# Patient Record
Sex: Male | Born: 1990 | Race: Black or African American | Hispanic: No | Marital: Married | Smoking: Light tobacco smoker
Health system: Southern US, Community
[De-identification: ages and names within clinical notes are randomized; demographics above are authoritative.]

---
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
## 2008-12-31 ENCOUNTER — Emergency Department (HOSPITAL_COMMUNITY): Admission: EM | Admit: 2008-12-31 | Discharge: 2008-12-31 | Payer: Self-pay | Admitting: Emergency Medicine

## 2009-01-09 ENCOUNTER — Encounter: Admission: RE | Admit: 2009-01-09 | Discharge: 2009-01-09 | Payer: Self-pay | Admitting: Orthopedic Surgery

## 2010-05-15 IMAGING — CR DG WRIST COMPLETE 3+V*L*
4 series · 4 of 4 positions shown · non-contrast
Comparison: None

CLINICAL DATA: Fell.

LEFT WRIST - COMPLETE 3+ VIEW

[x wrist pa left]
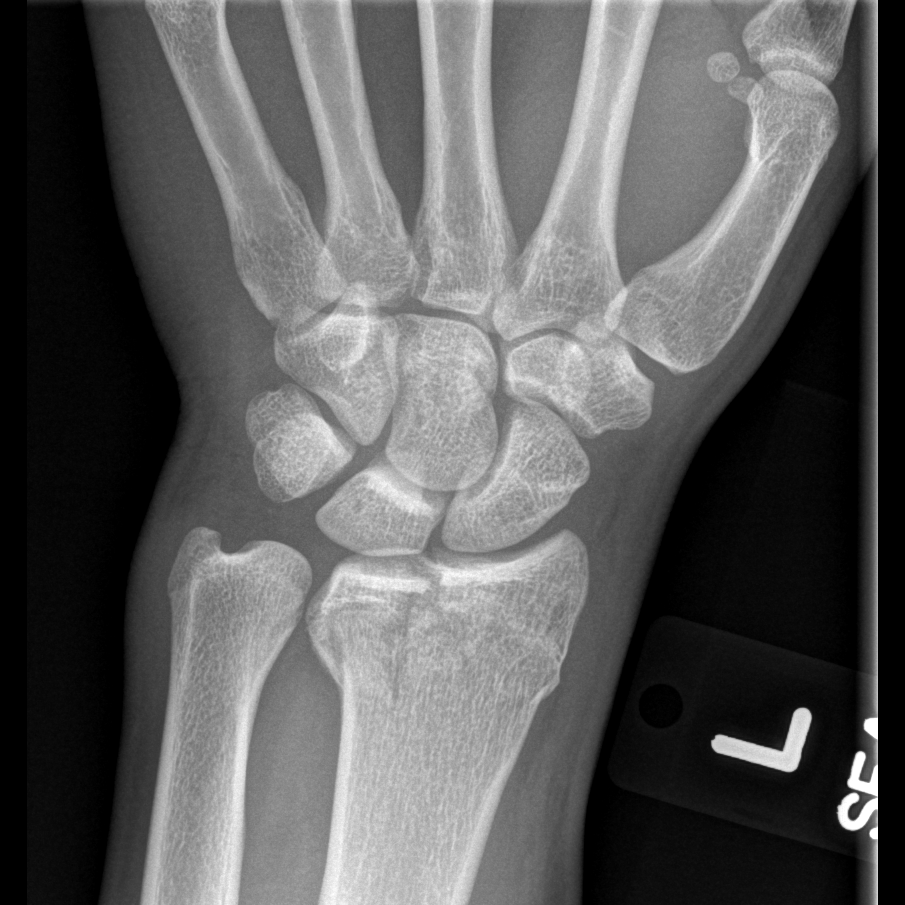

[x wrist obl left]
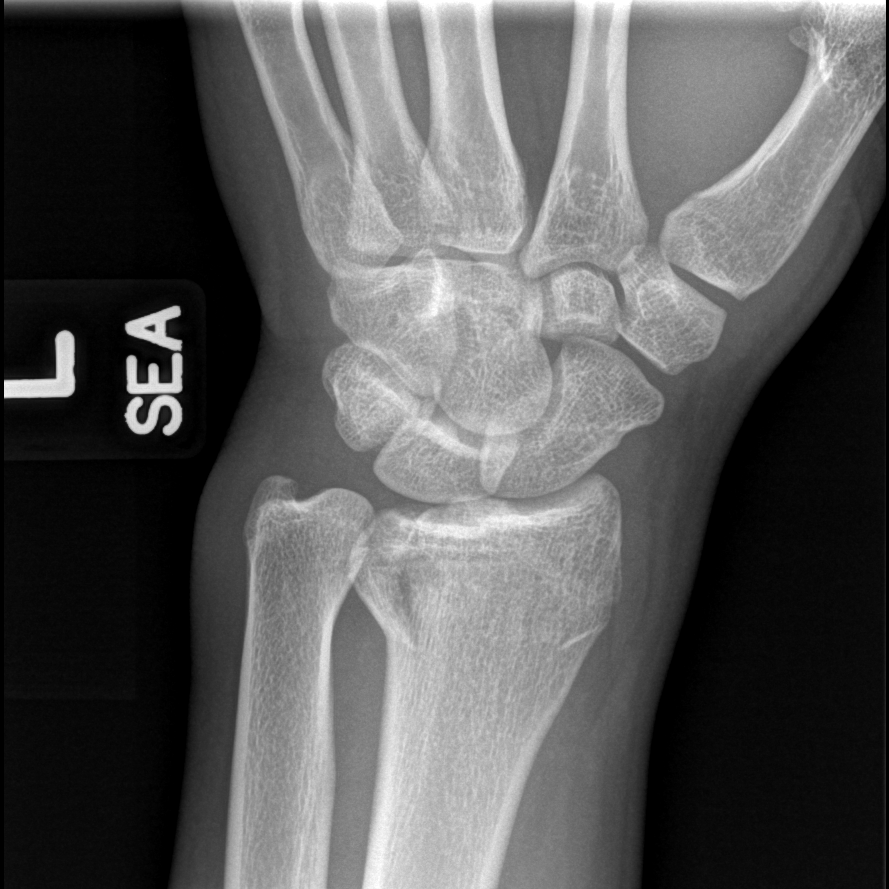

[x wrist lat left]
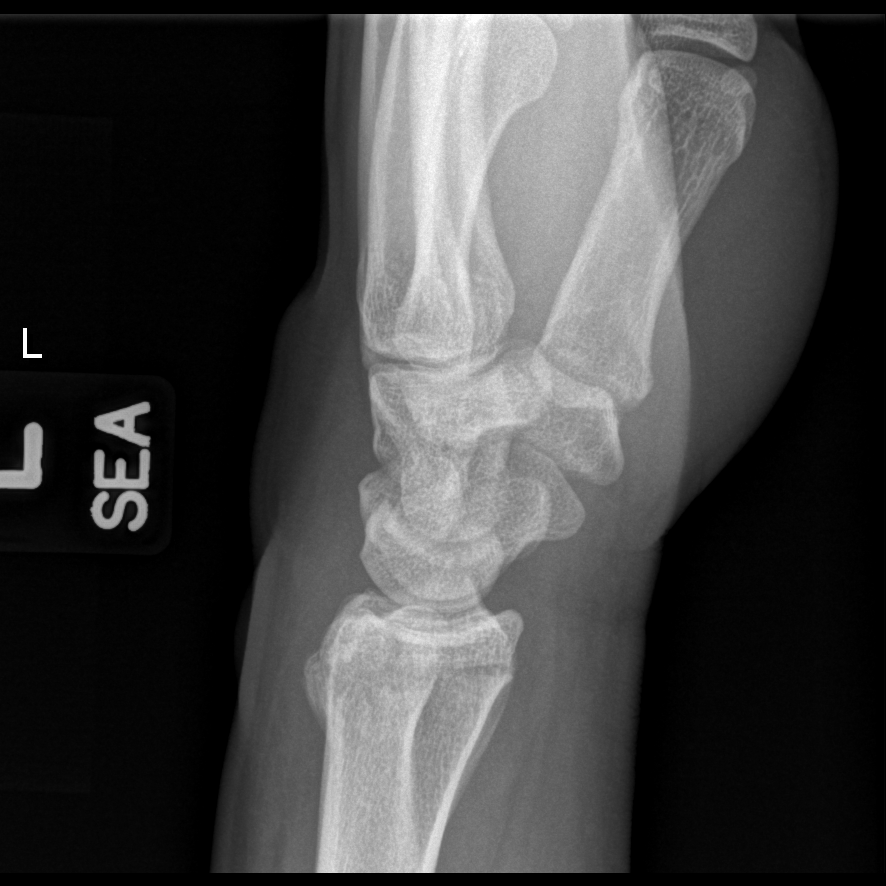

[x navicular]
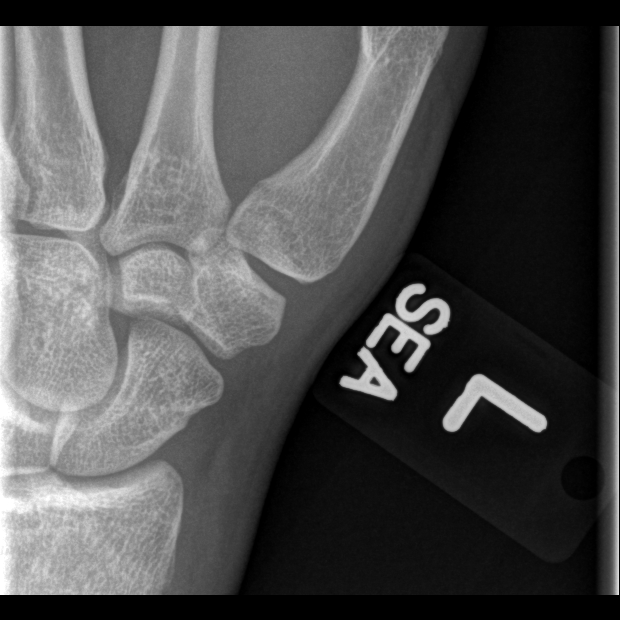

[4 of 4 positions shown; findings below may reference images not displayed]

FINDINGS: There is an intra-articular distal radius fracture with
minimal displacement.  There is also mild impaction.  No ulnar
fractures seen.  Carpal bones are intact .
IMPRESSION: Intra-articular fracture of the distal radius.

## 2014-01-04 ENCOUNTER — Telehealth: Payer: Self-pay | Admitting: General Practice

## 2014-01-04 NOTE — Telephone Encounter (Signed)
Ok with me 

## 2014-01-04 NOTE — Telephone Encounter (Signed)
Pt called request to be Dr. Yetta BarreJones new pt [facility] because his father Townsend RogerLeonard Dobies [birth year] is Dr. Yetta BarreJones pt already. Please advise.

## 2014-01-04 NOTE — Telephone Encounter (Signed)
The father will call to make an appt as a new pt with Dr. Yetta BarreJones when he gets home.

## 2014-01-26 ENCOUNTER — Ambulatory Visit: Payer: Self-pay | Admitting: Internal Medicine

## 2014-01-26 DIAGNOSIS — Z0289 Encounter for other administrative examinations: Secondary | ICD-10-CM

## 2018-11-18 ENCOUNTER — Other Ambulatory Visit: Payer: Self-pay

## 2018-11-18 ENCOUNTER — Emergency Department (HOSPITAL_COMMUNITY)
Admission: EM | Admit: 2018-11-18 | Discharge: 2018-11-18 | Disposition: A | Discharge status: Home / Self Care | Payer: Self-pay | Attending: Emergency Medicine | Admitting: Emergency Medicine

## 2018-11-18 ENCOUNTER — Encounter (HOSPITAL_COMMUNITY): Payer: Self-pay | Admitting: Emergency Medicine

## 2018-11-18 ENCOUNTER — Emergency Department (HOSPITAL_COMMUNITY): Payer: Self-pay

## 2018-11-18 DIAGNOSIS — R109 Unspecified abdominal pain: Secondary | ICD-10-CM | POA: Insufficient documentation

## 2018-11-18 DIAGNOSIS — R112 Nausea with vomiting, unspecified: Secondary | ICD-10-CM | POA: Insufficient documentation

## 2018-11-18 LAB — CBC
HCT: 50.2 % (ref 39.0–52.0)
Hemoglobin: 16.4 g/dL (ref 13.0–17.0)
MCH: 29.6 pg (ref 26.0–34.0)
MCHC: 32.7 g/dL (ref 30.0–36.0)
MCV: 90.6 fL (ref 80.0–100.0)
PLATELETS: 182 10*3/uL (ref 150–400)
RBC: 5.54 MIL/uL (ref 4.22–5.81)
RDW: 12 % (ref 11.5–15.5)
WBC: 9.1 10*3/uL (ref 4.0–10.5)
nRBC: 0 % (ref 0.0–0.2)

## 2018-11-18 LAB — COMPREHENSIVE METABOLIC PANEL
ALBUMIN: 4.3 g/dL (ref 3.5–5.0)
ALT: 76 U/L — ABNORMAL HIGH (ref 0–44)
AST: 67 U/L — ABNORMAL HIGH (ref 15–41)
Alkaline Phosphatase: 58 U/L (ref 38–126)
Anion gap: 11 (ref 5–15)
BILIRUBIN TOTAL: 0.8 mg/dL (ref 0.3–1.2)
BUN: 10 mg/dL (ref 6–20)
CALCIUM: 9.7 mg/dL (ref 8.9–10.3)
CO2: 25 mmol/L (ref 22–32)
Chloride: 102 mmol/L (ref 98–111)
Creatinine, Ser: 0.96 mg/dL (ref 0.61–1.24)
GFR calc Af Amer: >60 mL/min (ref >60)
GFR calc non Af Amer: >60 mL/min (ref >60)
GLUCOSE: 109 mg/dL — AB (ref 70–99)
Potassium: 4.3 mmol/L (ref 3.5–5.1)
SODIUM: 138 mmol/L (ref 135–145)
TOTAL PROTEIN: 7.8 g/dL (ref 6.5–8.1)

## 2018-11-18 LAB — LIPASE, BLOOD: Lipase: 33 U/L (ref 11–51)

## 2018-11-18 MED ORDER — ONDANSETRON 4 MG PO TBDP
4.0000 mg | ORAL_TABLET | Freq: Once | ORAL | Status: AC | PRN
  Administered 2018-11-18: 4 mg via ORAL
  Filled 2018-11-18: qty 1

## 2018-11-18 MED ORDER — ONDANSETRON HCL 4 MG/2ML IJ SOLN
4.0000 mg | Freq: Once | INTRAMUSCULAR | Status: AC
  Administered 2018-11-18: 4 mg via INTRAVENOUS
  Filled 2018-11-18: qty 2

## 2018-11-18 MED ORDER — SODIUM CHLORIDE 0.9% FLUSH
3.0000 mL | Freq: Once | INTRAVENOUS | Status: AC
  Administered 2018-11-18: 3 mL via INTRAVENOUS

## 2018-11-18 MED ORDER — DICYCLOMINE HCL 10 MG PO CAPS
10.0000 mg | ORAL_CAPSULE | Freq: Once | ORAL | Status: AC
  Administered 2018-11-18: 10 mg via ORAL
  Filled 2018-11-18: qty 1

## 2018-11-18 MED ORDER — ONDANSETRON 8 MG PO TBDP: 8.0000 mg | ORAL_TABLET | Freq: Three times a day (TID) | ORAL | 0 refills | Status: DC | PRN

## 2018-11-18 MED ORDER — SODIUM CHLORIDE 0.9 % IV BOLUS
1000.0000 mL | Freq: Once | INTRAVENOUS | Status: AC
  Administered 2018-11-18: 1000 mL via INTRAVENOUS

## 2018-11-18 MED ORDER — DICYCLOMINE HCL 20 MG PO TABS: 20.0000 mg | ORAL_TABLET | Freq: Two times a day (BID) | ORAL | 0 refills | Status: DC | PRN

## 2018-11-18 NOTE — ED Notes (Signed)
Discharge instructions and prescriptions discussed with Pt. Pt verbalized understanding. Pt stable and ambulatory.   

## 2018-11-18 NOTE — Discharge Instructions (Signed)
Take the medications as needed for nausea, follow up with a primary care doctor if the symptoms persist, return as needed for worsening symptoms

## 2018-11-18 NOTE — ED Provider Notes (Signed)
[facility] EMERGENCY DEPARTMENT Provider Note   CSN: [id number] Arrival date & time: 11/18/18  1611     History   Chief Complaint Chief Complaint  Patient presents with  . Emesis    HPI Sean Simon is a 28 y.o. male.  HPI Presents to the emergency room for evaluation of abdominal pain nausea and vomiting.  Patient states he has been having trouble with nausea for the past several days.  However today he had increasing symptoms with multiple episodes of dry heaves and vomiting.  Patient states he has had 8 episodes today.  He then vomited while waiting here in the emergency room.  When he has the vomiting he experiences severe abdominal cramping primarily in the lower abdomen.  He has not been able to keep down any food.  Patient denies any diarrhea or constipation.  He denies any dysuria. History reviewed. No pertinent past medical history.  There are no active problems to display for this patient.   History reviewed. No pertinent surgical history.      Home Medications    Prior to Admission medications   Medication Sig Start Date End Date Taking? Authorizing Provider  dicyclomine (BENTYL) 20 MG tablet Take 1 tablet (20 mg total) by mouth 2 (two) times daily as needed for spasms. 11/18/18   Linwood Dibbles, MD  ondansetron (ZOFRAN ODT) 8 MG disintegrating tablet Take 1 tablet (8 mg total) by mouth every 8 (eight) hours as needed for nausea or vomiting. 11/18/18   Linwood Dibbles, MD    Family History No family history on file.  Social History Social History   Tobacco Use  . Smoking status: Not on file  Substance Use Topics  . Alcohol use: Not on file  . Drug use: Not on file     Allergies   Patient has no known allergies.   Review of Systems Review of Systems  All other systems reviewed and are negative.    Physical Exam Updated Vital Signs BP 134/88   Pulse 76   Temp 98.4 F (36.9 C) (Oral)   Resp (!) 21   Ht 1.829 m (6')   Wt 111.1 kg    SpO2 100%   BMI 33.23 kg/m   Physical Exam Vitals signs and nursing note reviewed.  Constitutional:      General: He is not in acute distress.    Appearance: He is well-developed.  HENT:     Head: Normocephalic and atraumatic.     Right Ear: External ear normal.     Left Ear: External ear normal.  Eyes:     General: No scleral icterus.       Right eye: No discharge.        Left eye: No discharge.     Conjunctiva/sclera: Conjunctivae normal.  Neck:     Musculoskeletal: Neck supple.     Trachea: No tracheal deviation.  Cardiovascular:     Rate and Rhythm: Normal rate and regular rhythm.  Pulmonary:     Effort: Pulmonary effort is normal. No respiratory distress.     Breath sounds: Normal breath sounds. No stridor. No wheezing or rales.  Abdominal:     General: Bowel sounds are normal. There is no distension.     Palpations: Abdomen is soft.     Tenderness: There is no abdominal tenderness. There is no guarding or rebound.  Musculoskeletal:        General: No tenderness.  Skin:    General: Skin is warm  and dry.     Findings: No rash.  Neurological:     Mental Status: He is alert.     Cranial Nerves: No cranial nerve deficit (no facial droop, extraocular movements intact, no slurred speech).     Sensory: No sensory deficit.     Motor: No abnormal muscle tone or seizure activity.     Coordination: Coordination normal.      ED Treatments / Results  Labs (all labs ordered are listed, but only abnormal results are displayed) Labs Reviewed  COMPREHENSIVE METABOLIC PANEL - Abnormal; Notable for the following components:      Result Value   Glucose, Bld 109 (*)    AST 67 (*)    ALT 76 (*)    All other components within normal limits  LIPASE, BLOOD  CBC  URINALYSIS, ROUTINE W REFLEX MICROSCOPIC     Radiology [place] Abdomen Complete  Result Date: 11/18/2018 CLINICAL DATA:  Abdominal pain, nausea, and vomiting. Elevated liver function studies. EXAM: ABDOMEN ULTRASOUND  COMPLETE COMPARISON:  None. FINDINGS: Gallbladder: No gallstones or wall thickening visualized. No sonographic Murphy sign noted by sonographer. Common bile duct: Diameter: 3.5 mm, normal Liver: No focal lesion identified. Within normal limits in parenchymal echogenicity. Portal vein is patent on color Doppler imaging with normal direction of blood flow towards the liver. IVC: No abnormality visualized. Pancreas: Visualized portion unremarkable. Spleen: Size and appearance within normal limits. Right Kidney: Length: 11.2 cm. Echogenicity within normal limits. No mass or hydronephrosis visualized. Left Kidney: Length: 12 cm. Echogenicity within normal limits. No mass or hydronephrosis visualized. Abdominal aorta: No aneurysm visualized. Other findings: None. IMPRESSION: No acute abnormalities identified. Electronically Signed   By: Burman NievesWilliam  Stevens M.D.   On: 11/18/2018 21:37    Procedures Procedures (including critical care time)  Medications Ordered in ED Medications  sodium chloride flush (NS) 0.9 % injection 3 mL (3 mLs Intravenous Given 11/18/18 1800)  ondansetron (ZOFRAN-ODT) disintegrating tablet 4 mg (4 mg Oral Given 11/18/18 1711)  ondansetron (ZOFRAN) injection 4 mg (4 mg Intravenous Given 11/18/18 1838)  sodium chloride 0.9 % bolus 1,000 mL (0 mLs Intravenous Stopped 11/18/18 2009)  dicyclomine (BENTYL) capsule 10 mg (10 mg Oral Given 11/18/18 2013)     Initial Impression / Assessment and Plan / ED Course  I have reviewed the triage vital signs and the nursing notes.  Pertinent labs & imaging results that were available during my care of the patient were reviewed by me and considered in my medical decision making (see chart for details).   Patient presented to the emergency room for evaluation of abdominal pain and vomiting.  Laboratory tests were notable for mild increase in his LFTs.  Patient's ultrasound does not show any acute abnormality.  Patient was treated with IV fluid and  antiemetics.  His symptoms have improved.  He has not had any subsequent vomiting here in the ED.  Patient is feeling better and ready to go home.  Sxmay be related to a viral illness.  I doubt appendicitis or other emergent etiology at this time.  Final Clinical Impressions(s) / ED Diagnoses   Final diagnoses:  Non-intractable vomiting with nausea, unspecified vomiting type  Abdominal pain, unspecified abdominal location    ED Discharge Orders         Ordered    ondansetron (ZOFRAN ODT) 8 MG disintegrating tablet  Every 8 hours PRN     11/18/18 2240    dicyclomine (BENTYL) 20 MG tablet  2 times daily PRN  11/18/18 2240           Linwood DibblesKnapp, Tempie Gibeault, MD 11/18/18 2241

## 2018-11-18 NOTE — ED Notes (Signed)
Patient transported to Ultrasound 

## 2018-11-18 NOTE — ED Notes (Signed)
Pt aware that a urine sample is needed and will provide one when able. 

## 2018-11-18 NOTE — ED Triage Notes (Signed)
Pt states he has felt sick to his stomach for several days, worse today and has vomited at least 8 times today, last able to keep food down in 2 days. Pt found vomiting in the bathroom when searching for him for triage. Pt states he does smoke marijuana.

## 2020-01-02 ENCOUNTER — Encounter (HOSPITAL_COMMUNITY): Payer: Self-pay | Admitting: Emergency Medicine

## 2020-01-02 ENCOUNTER — Emergency Department (HOSPITAL_COMMUNITY): Payer: Self-pay

## 2020-01-02 ENCOUNTER — Emergency Department (HOSPITAL_COMMUNITY)
Admission: EM | Admit: 2020-01-02 | Discharge: 2020-01-02 | Disposition: A | Discharge status: Home / Self Care | Payer: Self-pay | Attending: Emergency Medicine | Admitting: Emergency Medicine

## 2020-01-02 ENCOUNTER — Other Ambulatory Visit: Payer: Self-pay

## 2020-01-02 DIAGNOSIS — Z20822 Contact with and (suspected) exposure to covid-19: Secondary | ICD-10-CM | POA: Insufficient documentation

## 2020-01-02 DIAGNOSIS — R1032 Left lower quadrant pain: Secondary | ICD-10-CM | POA: Insufficient documentation

## 2020-01-02 DIAGNOSIS — R112 Nausea with vomiting, unspecified: Secondary | ICD-10-CM | POA: Insufficient documentation

## 2020-01-02 DIAGNOSIS — F17200 Nicotine dependence, unspecified, uncomplicated: Secondary | ICD-10-CM | POA: Insufficient documentation

## 2020-01-02 DIAGNOSIS — R197 Diarrhea, unspecified: Secondary | ICD-10-CM | POA: Insufficient documentation

## 2020-01-02 LAB — CBC WITH DIFFERENTIAL/PLATELET
Abs Immature Granulocytes: 0.09 10*3/uL — ABNORMAL HIGH (ref 0.00–0.07)
Basophils Absolute: 0 10*3/uL (ref 0.0–0.1)
Basophils Relative: 0 %
Eosinophils Absolute: 0 10*3/uL (ref 0.0–0.5)
Eosinophils Relative: 0 %
HCT: 53.7 % — ABNORMAL HIGH (ref 39.0–52.0)
Hemoglobin: 17.8 g/dL — ABNORMAL HIGH (ref 13.0–17.0)
Immature Granulocytes: 1 %
Lymphocytes Relative: 3 %
Lymphs Abs: 0.5 10*3/uL — ABNORMAL LOW (ref 0.7–4.0)
MCH: 31 pg (ref 26.0–34.0)
MCHC: 33.1 g/dL (ref 30.0–36.0)
MCV: 93.6 fL (ref 80.0–100.0)
Monocytes Absolute: 0.7 10*3/uL (ref 0.1–1.0)
Monocytes Relative: 4 %
Neutro Abs: 15.8 10*3/uL — ABNORMAL HIGH (ref 1.7–7.7)
Neutrophils Relative %: 92 %
Platelets: 211 10*3/uL (ref 150–400)
RBC: 5.74 MIL/uL (ref 4.22–5.81)
RDW: 12.4 % (ref 11.5–15.5)
WBC: 17 10*3/uL — ABNORMAL HIGH (ref 4.0–10.5)
nRBC: 0 % (ref 0.0–0.2)

## 2020-01-02 LAB — COMPREHENSIVE METABOLIC PANEL
ALT: 30 U/L (ref 0–44)
AST: 29 U/L (ref 15–41)
Albumin: 5.1 g/dL — ABNORMAL HIGH (ref 3.5–5.0)
Alkaline Phosphatase: 69 U/L (ref 38–126)
Anion gap: 14 (ref 5–15)
BUN: 13 mg/dL (ref 6–20)
CO2: 22 mmol/L (ref 22–32)
Calcium: 9.9 mg/dL (ref 8.9–10.3)
Chloride: 105 mmol/L (ref 98–111)
Creatinine, Ser: 1.11 mg/dL (ref 0.61–1.24)
GFR calc Af Amer: >60 mL/min (ref >60)
GFR calc non Af Amer: >60 mL/min (ref >60)
Glucose, Bld: 117 mg/dL — ABNORMAL HIGH (ref 70–99)
Potassium: 3.9 mmol/L (ref 3.5–5.1)
Sodium: 141 mmol/L (ref 135–145)
Total Bilirubin: 1.2 mg/dL (ref 0.3–1.2)
Total Protein: 8.5 g/dL — ABNORMAL HIGH (ref 6.5–8.1)

## 2020-01-02 LAB — LIPASE, BLOOD: Lipase: 20 U/L (ref 11–51)

## 2020-01-02 MED ORDER — ONDANSETRON 4 MG PO TBDP: 4.0000 mg | ORAL_TABLET | Freq: Three times a day (TID) | ORAL | 0 refills | Status: AC | PRN

## 2020-01-02 MED ORDER — METOCLOPRAMIDE HCL 5 MG/ML IJ SOLN
10.0000 mg | Freq: Once | INTRAMUSCULAR | Status: AC
Start: 2020-01-02 — End: 2020-01-02
  Administered 2020-01-02: 16:00:00 10 mg via INTRAVENOUS
  Filled 2020-01-02: qty 2

## 2020-01-02 MED ORDER — PANTOPRAZOLE SODIUM 40 MG IV SOLR
40.0000 mg | Freq: Once | INTRAVENOUS | Status: AC
  Administered 2020-01-02: 40 mg via INTRAVENOUS
  Filled 2020-01-02: qty 40

## 2020-01-02 MED ORDER — SODIUM CHLORIDE 0.9 % IV BOLUS
1000.0000 mL | Freq: Once | INTRAVENOUS | Status: AC
  Administered 2020-01-02: 1000 mL via INTRAVENOUS

## 2020-01-02 MED ORDER — DICYCLOMINE HCL 20 MG PO TABS: 20.0000 mg | ORAL_TABLET | Freq: Two times a day (BID) | ORAL | 0 refills | Status: AC | PRN

## 2020-01-02 MED ORDER — SODIUM CHLORIDE 0.9 % IV BOLUS
1000.0000 mL | Freq: Once | INTRAVENOUS | Status: AC
  Administered 2020-01-02: 16:00:00 1000 mL via INTRAVENOUS

## 2020-01-02 MED ORDER — DICYCLOMINE HCL 10 MG PO CAPS
10.0000 mg | ORAL_CAPSULE | Freq: Once | ORAL | Status: AC
  Administered 2020-01-02: 10 mg via ORAL
  Filled 2020-01-02: qty 1

## 2020-01-02 MED ORDER — ONDANSETRON HCL 4 MG/2ML IJ SOLN
4.0000 mg | Freq: Once | INTRAMUSCULAR | Status: AC
  Administered 2020-01-02: 12:00:00 4 mg via INTRAVENOUS
  Filled 2020-01-02: qty 2

## 2020-01-02 MED ORDER — LOPERAMIDE HCL 2 MG PO CAPS: 2.0000 mg | ORAL_CAPSULE | Freq: Four times a day (QID) | ORAL | 0 refills | Status: AC | PRN

## 2020-01-02 MED ORDER — IOHEXOL 300 MG/ML  SOLN
100.0000 mL | Freq: Once | INTRAMUSCULAR | Status: AC | PRN
  Administered 2020-01-02: 100 mL via INTRAVENOUS

## 2020-01-02 NOTE — Discharge Instructions (Addendum)
Please take your medications, as prescribed.  Be sure to drink plenty of fluids to replace fluids lost with vomiting and diarrhea.  Please return to the ED or seek immediate medical attention should you experience any new or worsening symptoms.

## 2020-01-02 NOTE — ED Provider Notes (Signed)
[facility] EMERGENCY DEPARTMENT Provider Note   CSN: [id number] Arrival date & time: 01/02/20  1123     History Chief Complaint  Patient presents with  . Abdominal Pain  . Emesis    Sean Simon is a 29 y.o. male with no significant PMH who presents to the ED with complaints of nausea, vomiting, and diarrhea.  I obtained history from EMS reports that he has had a 6-hour history of uncontrolled nausea and vomiting and that his vital signs were reassuring in route to the hospital.  After speaking with the patient, he reports that he was playing basketball yesterday and then proceeded to eat a large [place] cheese steak in addition to buffalo wings from [employer].  He then proceeded to have some reflux symptoms last evening prior to going to sleep.  His nausea symptoms woke him from sleep at 4 AM and he has been having approximately one episode of nonbloody emesis each hour with his uncontrolled nausea symptoms.  Patient reports he has a history of reflux disease, but it typically resolves spontaneously and rarely progresses to nausea vomiting.  He has had two, loose watery nonbloody nonmelanotic stools this morning.  He is denying any significant abdominal discomfort on my examination, and also denies any recent illness, fevers or chills, chest pain or difficulty breathing, urinary symptoms, penile discharge or testicular pain, headache or dizziness, or other symptoms.  Patient does endorse regular marijuana use.  HPI     History reviewed. No pertinent past medical history.  There are no problems to display for this patient.   History reviewed. No pertinent surgical history.     No family history on file.  Social History   Tobacco Use  . Smoking status: Light Tobacco Smoker  Substance Use Topics  . Alcohol use: Yes  . Drug use: Yes    Types: Marijuana    Home Medications Prior to Admission medications   Medication Sig Start Date End Date Taking?  Authorizing Provider  acetaminophen (TYLENOL) 500 MG tablet Take 500 mg by mouth every 6 (six) hours as needed for mild pain.   Yes [provider]  dicyclomine (BENTYL) 20 MG tablet Take 1 tablet (20 mg total) by mouth 2 (two) times daily between meals as needed for spasms. 01/02/20   Lorelee New, PA-C  loperamide (IMODIUM) 2 MG capsule Take 1 capsule (2 mg total) by mouth 4 (four) times daily as needed for diarrhea or loose stools. 01/02/20   Lorelee New, PA-C  ondansetron (ZOFRAN ODT) 4 MG disintegrating tablet Take 1 tablet (4 mg total) by mouth every 8 (eight) hours as needed for nausea or vomiting. 01/02/20   Lorelee New, PA-C    Allergies    Grass pollen([id number]-[id number]-swt vern)  Review of Systems   Review of Systems  Constitutional: Positive for chills. Negative for fever.  Respiratory: Negative for shortness of breath.   Cardiovascular: Negative for chest pain.  Gastrointestinal: Positive for diarrhea, nausea and vomiting. Negative for abdominal pain, blood in stool and rectal pain.    Physical Exam Updated Vital Signs BP (!) 155/84   Pulse 96   Temp (!) 97.5 F (36.4 C) (Oral)   Resp 17   Ht 6' (1.829 m)   Wt 112.9 kg   SpO2 96%   BMI 33.76 kg/m   Physical Exam Vitals and nursing note reviewed. Exam conducted with a chaperone present.  Constitutional:      General: He is not in acute  distress.    Appearance: Normal appearance. He is well-developed.  HENT:     Head: Normocephalic and atraumatic.  Eyes:     General: No scleral icterus.    Conjunctiva/sclera: Conjunctivae normal.  Cardiovascular:     Rate and Rhythm: Normal rate and regular rhythm.     Pulses: Normal pulses.     Heart sounds: Normal heart sounds.  Pulmonary:     Effort: Pulmonary effort is normal. No respiratory distress.     Breath sounds: Normal breath sounds.  Abdominal:     Comments: Soft, nondistended.  No focal TTP.  No guarding.  No overlying skin changes.  Normoactive  bowel sounds.  Musculoskeletal:     Cervical back: Normal range of motion. No rigidity.  Skin:    General: Skin is dry.     Capillary Refill: Capillary refill takes less than 2 seconds.  Neurological:     Mental Status: He is alert and oriented to person, place, and time.     GCS: GCS eye subscore is 4. GCS verbal subscore is 5. GCS motor subscore is 6.  Psychiatric:        Mood and Affect: Mood normal.        Behavior: Behavior normal.        Thought Content: Thought content normal.     ED Results / Procedures / Treatments   Labs (all labs ordered are listed, but only abnormal results are displayed) Labs Reviewed  COMPREHENSIVE METABOLIC PANEL - Abnormal; Notable for the following components:      Result Value   Glucose, Bld 117 (*)    Total Protein 8.5 (*)    Albumin 5.1 (*)    All other components within normal limits  CBC WITH DIFFERENTIAL/PLATELET - Abnormal; Notable for the following components:   WBC 17.0 (*)    Hemoglobin 17.8 (*)    HCT 53.7 (*)    Neutro Abs 15.8 (*)    Lymphs Abs 0.5 (*)    Abs Immature Granulocytes 0.09 (*)    All other components within normal limits  SARS CORONAVIRUS 2 (TAT 6-24 HRS)  LIPASE, BLOOD    EKG None  Radiology CT ABDOMEN PELVIS W CONTRAST  Result Date: 01/02/2020 CLINICAL DATA:  Acute generalized abdominal pain. Neutropenia. Vomiting. EXAM: CT ABDOMEN AND PELVIS WITH CONTRAST TECHNIQUE: Multidetector CT imaging of the abdomen and pelvis was performed using the standard protocol following bolus administration of intravenous contrast. CONTRAST:  OMNIPAQUE IOHEXOL 300 MG/ML  SOLN COMPARISON:  None. FINDINGS: Lower chest: Hypoventilatory atelectasis. No pleural fluid. Hepatobiliary: No focal liver abnormality is seen. No gallstones, gallbladder wall thickening, or biliary dilatation. Pancreas: No ductal dilatation or inflammation. Spleen: Normal in size without focal abnormality. Adrenals/Urinary Tract: Normal adrenal glands. No  hydronephrosis or perinephric edema. Homogeneous renal enhancement. Urinary bladder is partially distended without wall thickening. Stomach/Bowel: Fluid-filled stomach without gastric wall thickening. Fluid-filled small bowel without small bowel thickening or inflammation. Normal appendix. Fluid-filled colon without colonic wall thickening or pericolonic edema. Vascular/Lymphatic: Abdominal aorta is normal in caliber. The portal vein is patent. Small central mesenteric lymph nodes are likely reactive. No enlarged lymph nodes in the abdomen or pelvis. Reproductive: Prostate is unremarkable. Other: No free air, free fluid, or intra-abdominal fluid collection. Small fat containing umbilical hernia. Musculoskeletal: There are no acute or suspicious osseous abnormalities. IMPRESSION: 1. Fluid-filled stomach, small bowel, and colon consistent with diarrheal process. No bowel inflammation or obstruction. 2. Small fat containing umbilical hernia. Electronically Signed  By: Narda Rutherford M.D.   On: 01/02/2020 18:24    Procedures Procedures (including critical care time)  Medications Ordered in ED Medications  sodium chloride 0.9 % bolus 1,000 mL (0 mLs Intravenous Stopped 01/02/20 1340)  ondansetron (ZOFRAN) injection 4 mg (4 mg Intravenous Given 01/02/20 1227)  pantoprazole (PROTONIX) injection 40 mg (40 mg Intravenous Given 01/02/20 1226)  dicyclomine (BENTYL) capsule 10 mg (10 mg Oral Given 01/02/20 1350)  sodium chloride 0.9 % bolus 1,000 mL (0 mLs Intravenous Stopped 01/02/20 1842)  metoCLOPramide (REGLAN) injection 10 mg (10 mg Intravenous Given 01/02/20 1554)  iohexol (OMNIPAQUE) 300 MG/ML solution 100 mL (100 mLs Intravenous Contrast Given 01/02/20 1758)    ED Course  I have reviewed the triage vital signs and the nursing notes.  Pertinent labs & imaging results that were available during my care of the patient were reviewed by me and considered in my medical decision making (see chart for  details).    MDM Rules/Calculators/A&P                      On reexamination, patient is complaining of mild discomfort in LLQ and suprapubic region that is intermittent and not worse with palpation.  He reports brief episodes of 8 out of 10 discomfort.  No overlying skin changes.  He also endorses mild alleviation of symptoms with defecation, which would be consistent with an IBS picture.  Will provide patient with Bentyl here in addition to his Protonix, Zofran, and fluids.   Patient had a leukocytosis of 17.0, but otherwise his lab work was unremarkable.  Given his improvement with Zofran, IVF, Bentyl, and Protonix, decided to p.o. challenge the patient.  Shortly thereafter, he once again became sick with nausea, emesis, and large loose stool.  He continued to endorse intermittent suprapubic pain with no obvious aggravating or alleviating factors.  Given his collection of symptoms, lower suspicion for a cannabinoid induced cyclic vomiting syndrome and instead suspect infective gastroenteritis as more likely culprit.  However, given his suprapubic pain in conjunction with elevated WBC and other symptoms, will obtain CT abdomen pelvis with contrast to assess for acute appendicitis.  I reviewed CT obtained of abdomen pelvis which revealed normal-appearing appendix and instead demonstrated fluid-filled stomach and intestines consistent with a diarrheal process.  No significant enteritis or other acute processes identified.  Explained to patient he was reassured by today's comprehensive work-up.  Patient is safe for discharge home with antiemetics, Bentyl, Imodium, and instructions to consume plenty of fluids.  Given brevity of his illness in addition to lack of hematochezia or melena, suspect viral etiology at this time and do not feel as though antibiotics are warranted.  ED return precautions discussed.  All of the evaluation and work-up results were discussed with the patient and any family at bedside.  They were provided opportunity to ask any additional questions and have none at this time. They have expressed understanding of verbal discharge instructions as well as return precautions and are agreeable to the plan.   Sean Simon was evaluated in Emergency Department on 01/02/2020 for the symptoms described in the history of present illness. He was evaluated in the context of the global COVID-19 pandemic, which necessitated consideration that the patient might be at risk for infection with the SARS-CoV-2 virus that causes COVID-19. Institutional protocols and algorithms that pertain to the evaluation of patients at risk for COVID-19 are in a state of rapid change based on information released by regulatory bodies  including the CDC and federal and state organizations. These policies and algorithms were followed during the patient's care in the ED.  Final Clinical Impression(s) / ED Diagnoses Final diagnoses:  Nausea vomiting and diarrhea    Rx / DC Orders ED Discharge Orders         Ordered    dicyclomine (BENTYL) 20 MG tablet  2 times daily between meals PRN     01/02/20 1911    loperamide (IMODIUM) 2 MG capsule  4 times daily PRN     01/02/20 1911    ondansetron (ZOFRAN ODT) 4 MG disintegrating tablet  Every 8 hours PRN     01/02/20 1911           Reita Chard 01/02/20 1913    Sherwood Gambler, MD 01/03/20 [id number]

## 2020-01-02 NOTE — ED Notes (Signed)
tolerating PO fluids

## 2020-01-02 NOTE — ED Triage Notes (Signed)
Pt in w/abdominal pain and emesis since last night. States he thinks he ate some bad chicken wings from [employer] last night. C/o one episode of diarrhea this am. No fever, states he has chills though. No reported sick contacts

## 2020-01-03 LAB — SARS CORONAVIRUS 2 (TAT 6-24 HRS): SARS Coronavirus 2: NEGATIVE

## 2020-07-13 ENCOUNTER — Emergency Department (HOSPITAL_COMMUNITY)
Admission: EM | Admit: 2020-07-13 | Discharge: 2020-07-13 | Disposition: A | Discharge status: Home / Self Care | Payer: Self-pay | Attending: Emergency Medicine | Admitting: Emergency Medicine

## 2020-07-13 ENCOUNTER — Encounter (HOSPITAL_COMMUNITY): Payer: Self-pay | Admitting: Emergency Medicine

## 2020-07-13 DIAGNOSIS — L03116 Cellulitis of left lower limb: Secondary | ICD-10-CM | POA: Insufficient documentation

## 2020-07-13 DIAGNOSIS — F17200 Nicotine dependence, unspecified, uncomplicated: Secondary | ICD-10-CM | POA: Insufficient documentation

## 2020-07-13 DIAGNOSIS — L03113 Cellulitis of right upper limb: Secondary | ICD-10-CM

## 2020-07-13 DIAGNOSIS — Z23 Encounter for immunization: Secondary | ICD-10-CM | POA: Insufficient documentation

## 2020-07-13 MED ORDER — TETANUS-DIPHTH-ACELL PERTUSSIS 5-2.5-18.5 LF-MCG/0.5 IM SUSP
0.5000 mL | Freq: Once | INTRAMUSCULAR | Status: AC
  Administered 2020-07-13: 0.5 mL via INTRAMUSCULAR
  Filled 2020-07-13: qty 0.5

## 2020-07-13 MED ORDER — CLINDAMYCIN HCL 300 MG PO CAPS: 300.0000 mg | ORAL_CAPSULE | Freq: Four times a day (QID) | ORAL | 0 refills | Status: AC

## 2020-07-13 MED ORDER — CLINDAMYCIN HCL 150 MG PO CAPS
300.0000 mg | ORAL_CAPSULE | Freq: Once | ORAL | Status: AC
  Administered 2020-07-13: 300 mg via ORAL
  Filled 2020-07-13: qty 2

## 2020-07-13 NOTE — ED Provider Notes (Signed)
[facility] EMERGENCY DEPARTMENT Provider Note   CSN: [id number] Arrival date & time: 07/13/20  4132     History Chief Complaint  Patient presents with  . Cellulitis    Sean Simon is a 29 y.o. male.  The history is provided by the patient.  Illness Location:  Right forearm Severity:  Mild Onset quality:  Gradual Duration:  3 days Timing:  Constant Progression:  Unchanged Chronicity:  New Context:  Patient states concern for infection in right forearm tatoo.  Relieved by:  Nothing Worsened by:  Nothing Associated symptoms: no fever and no rash        History reviewed. No pertinent past medical history.  There are no problems to display for this patient.   History reviewed. No pertinent surgical history.     No family history on file.  Social History   Tobacco Use  . Smoking status: Light Tobacco Smoker  Substance Use Topics  . Alcohol use: Yes  . Drug use: Yes    Types: Marijuana    Home Medications Prior to Admission medications   Medication Sig Start Date End Date Taking? Authorizing Provider  acetaminophen (TYLENOL) 500 MG tablet Take 500 mg by mouth every 6 (six) hours as needed for mild pain.    [provider]  clindamycin (CLEOCIN) 300 MG capsule Take 1 capsule (300 mg total) by mouth 4 (four) times daily for 10 days. 07/13/20 07/23/20  Oceania Noori, DO  dicyclomine (BENTYL) 20 MG tablet Take 1 tablet (20 mg total) by mouth 2 (two) times daily between meals as needed for spasms. 01/02/20   Lorelee New, PA-C  loperamide (IMODIUM) 2 MG capsule Take 1 capsule (2 mg total) by mouth 4 (four) times daily as needed for diarrhea or loose stools. 01/02/20   Lorelee New, PA-C  ondansetron (ZOFRAN ODT) 4 MG disintegrating tablet Take 1 tablet (4 mg total) by mouth every 8 (eight) hours as needed for nausea or vomiting. 01/02/20   Lorelee New, PA-C    Allergies    Grass pollen(k-o-r-t-swt vern)  Review of Systems    Review of Systems  Constitutional: Negative for fever.  Musculoskeletal: Negative for arthralgias and joint swelling.  Skin: Positive for color change and wound. Negative for pallor and rash.  Neurological: Negative for weakness and numbness.    Physical Exam Updated Vital Signs  ED Triage Vitals [07/13/20 0953]  Enc Vitals Group     BP (!) 149/66     Pulse Rate 86     Resp 20     Temp 98.8 F (37.1 C)     Temp Source Oral     SpO2 99 %     Weight 245 lb (111.1 kg)     Height 6' (1.829 m)     Head Circumference      Peak Flow      Pain Score      Pain Loc      Pain Edu?      Excl. in GC?     Physical Exam Constitutional:      General: He is not in acute distress.    Appearance: He is not ill-appearing.  Cardiovascular:     Pulses: Normal pulses.  Skin:    General: Skin is warm.     Capillary Refill: Capillary refill takes less than 2 seconds.     Findings: Erythema present.     Comments: Right forearm mildly swollen with some overlying skin changes, no  obvious fluctuance, there is 1 area that appears and may be be starting to come to a head  Neurological:     Sensory: No sensory deficit.     Motor: No weakness.     ED Results / Procedures / Treatments   Labs (all labs ordered are listed, but only abnormal results are displayed) Labs Reviewed - No data to display  EKG None  Radiology No results found.  Procedures Procedures (including critical care time)  Medications Ordered in ED Medications  Tdap (BOOSTRIX) injection 0.5 mL (has no administration in time range)  clindamycin (CLEOCIN) capsule 300 mg (300 mg Oral Given 07/13/20 1049)    ED Course  I have reviewed the triage vital signs and the nursing notes.  Pertinent labs & imaging results that were available during my care of the patient were reviewed by me and considered in my medical decision making (see chart for details).    MDM Rules/Calculators/A&P                          Sean  Sean Simon is a 29 year old male who presents to the ED with concern for right forearm infection at tattoo site.  Normal vitals.  No fever.  Swelling for the last 3 days.  Tattoo was about 3 weeks ago.  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  Tetanus shot updated.  Appears to have a cellulitis in the right forearm.  Does not involve the elbow joint wrist joint.  No obvious abscess.  There is one spot where this may eventually occur as there appears to be a spot that may be starting to form a head but will hold off on any I&D at this time.  We will start clindamycin.  He understands to return in 48 to 72 hours if abscess starts to develop or if swelling is worse.  Will give a sling for comfort.  He understands wound care instructions.  Discharged in good condition.  Neurovascularly neuromuscularly intact.  Normal vitals.  No fever.  No concern for sepsis.  Denies IV drug use.  This chart was dictated using voice recognition software.  Despite best efforts to proofread,  errors can occur which can change the documentation meaning.    Final Clinical Impression(s) / ED Diagnoses Final diagnoses:  Cellulitis of right upper extremity    Rx / DC Orders ED Discharge Orders         Ordered    clindamycin (CLEOCIN) 300 MG capsule  4 times daily        07/13/20 1108           Kwynn Schlotter, DO 07/13/20 1110

## 2020-07-13 NOTE — Discharge Instructions (Signed)
Recommend warm compresses as discussed as often as you are able to.  Please keep this area clean and dry.  Please return if symptoms are getting worse especially after 72 hours of antibiotics.  Expect things to slightly get worse over the next 24 hours before improving while taking antibiotics.

## 2020-07-13 NOTE — ED Triage Notes (Signed)
Pt reports he got a tattoo 3 weeks ago, didn't take care of it properly and thinks it is now infected, area to R forearm, very tender, red with some pustules and swelling present. Denies fevers

## 2021-05-26 ENCOUNTER — Encounter (HOSPITAL_COMMUNITY): Payer: Self-pay | Admitting: Emergency Medicine

## 2021-05-26 ENCOUNTER — Other Ambulatory Visit: Payer: Self-pay

## 2021-05-26 ENCOUNTER — Emergency Department (HOSPITAL_COMMUNITY)
Admission: EM | Admit: 2021-05-26 | Discharge: 2021-05-27 | Disposition: A | Discharge status: Home / Self Care | Payer: BC Managed Care – PPO | Attending: Emergency Medicine | Admitting: Emergency Medicine

## 2021-05-26 DIAGNOSIS — L02811 Cutaneous abscess of head [any part, except face]: Secondary | ICD-10-CM | POA: Diagnosis not present

## 2021-05-26 DIAGNOSIS — F17210 Nicotine dependence, cigarettes, uncomplicated: Secondary | ICD-10-CM | POA: Diagnosis not present

## 2021-05-26 DIAGNOSIS — R220 Localized swelling, mass and lump, head: Secondary | ICD-10-CM | POA: Diagnosis present

## 2021-05-26 DIAGNOSIS — L0201 Cutaneous abscess of face: Secondary | ICD-10-CM

## 2021-05-26 LAB — CBC WITH DIFFERENTIAL/PLATELET
Abs Immature Granulocytes: 0.03 10*3/uL (ref 0.00–0.07)
Basophils Absolute: 0.1 10*3/uL (ref 0.0–0.1)
Basophils Relative: 1 %
Eosinophils Absolute: 0.2 10*3/uL (ref 0.0–0.5)
Eosinophils Relative: 2 %
HCT: 42.6 % (ref 39.0–52.0)
Hemoglobin: 14 g/dL (ref 13.0–17.0)
Immature Granulocytes: 0 %
Lymphocytes Relative: 36 %
Lymphs Abs: 3.6 10*3/uL (ref 0.7–4.0)
MCH: 30.9 pg (ref 26.0–34.0)
MCHC: 32.9 g/dL (ref 30.0–36.0)
MCV: 94 fL (ref 80.0–100.0)
Monocytes Absolute: 0.7 10*3/uL (ref 0.1–1.0)
Monocytes Relative: 7 %
Neutro Abs: 5.4 10*3/uL (ref 1.7–7.7)
Neutrophils Relative %: 54 %
Platelets: 219 10*3/uL (ref 150–400)
RBC: 4.53 MIL/uL (ref 4.22–5.81)
RDW: 12.9 % (ref 11.5–15.5)
WBC: 10 10*3/uL (ref 4.0–10.5)
nRBC: 0 % (ref 0.0–0.2)

## 2021-05-26 NOTE — ED Provider Notes (Signed)
Emergency Medicine Provider Triage Evaluation Note  Sean Simon , a 30 y.o. male  was evaluated in triage.  Pt complains of abscess to right jaw that has been intermittent for the past year. Patient notes swelling will appear for a few days, go away, then recur. No drainage. No fever or chills. Patient denies dental pain. No sore throat. Denies difficulties breathing or swallowing.  Review of Systems  Positive: Facial edema Negative: fever  Physical Exam  Ht 6' (1.829 m)   Wt 125 kg   BMI 37.37 kg/m  Gen:   Awake, no distress   Resp:  Normal effort  MSK:   Moves extremities without difficulty  Other:  Probable abscess to right jawline  Medical Decision Making  Medically screening exam initiated at 11:04 PM.  Appropriate orders placed.  Sean Simon was informed that the remainder of the evaluation will be completed by another provider, this initial triage assessment does not replace that evaluation, and the importance of remaining in the ED until their evaluation is complete.  labs   [name] 05/26/21 2305    Vanetta Mulders, MD 05/31/21 1610

## 2021-05-26 NOTE — ED Triage Notes (Signed)
Patient reports worsening skin abscess at right jaw with swelling and drainage onset last week , no fever or chills .

## 2021-05-27 LAB — BASIC METABOLIC PANEL
Anion gap: 8 (ref 5–15)
BUN: 11 mg/dL (ref 6–20)
CO2: 25 mmol/L (ref 22–32)
Calcium: 9.3 mg/dL (ref 8.9–10.3)
Chloride: 106 mmol/L (ref 98–111)
Creatinine, Ser: 1.22 mg/dL (ref 0.61–1.24)
GFR, Estimated: >60 mL/min (ref >60)
Glucose, Bld: 103 mg/dL — ABNORMAL HIGH (ref 70–99)
Potassium: 3.6 mmol/L (ref 3.5–5.1)
Sodium: 139 mmol/L (ref 135–145)

## 2021-05-27 MED ORDER — DOXYCYCLINE HYCLATE 100 MG PO CAPS: 100.0000 mg | ORAL_CAPSULE | Freq: Two times a day (BID) | ORAL | 0 refills | Status: AC

## 2021-05-27 MED ORDER — LIDOCAINE HCL (PF) 1 % IJ SOLN
5.0000 mL | Freq: Once | INTRAMUSCULAR | Status: AC
  Administered 2021-05-27: 5 mL
  Filled 2021-05-27: qty 5

## 2021-05-27 MED ORDER — DOXYCYCLINE HYCLATE 100 MG PO TABS
100.0000 mg | ORAL_TABLET | Freq: Once | ORAL | Status: AC
  Administered 2021-05-27: 100 mg via ORAL
  Filled 2021-05-27: qty 1

## 2021-05-27 MED ORDER — HYDROCODONE-ACETAMINOPHEN 5-325 MG PO TABS
1.0000 | ORAL_TABLET | Freq: Once | ORAL | Status: AC
  Administered 2021-05-27: 1 via ORAL
  Filled 2021-05-27: qty 1

## 2021-05-27 MED ORDER — HYDROCODONE-ACETAMINOPHEN 5-325 MG PO TABS: 1.0000 | ORAL_TABLET | ORAL | 0 refills | Status: AC | PRN

## 2021-05-27 NOTE — ED Notes (Signed)
Abscess rt side of jaw painful and swollen

## 2021-05-27 NOTE — ED Provider Notes (Signed)
[facility] EMERGENCY DEPARTMENT Provider Note   CSN: [id number] Arrival date & time: 05/26/21  2238     History Chief Complaint  Patient presents with   Skin Abscess    Sean Simon is a 30 y.o. male.  Patient to ED for evaluation of painful swelling under his right jaw. He reports there has been a small bump there for a long time, becoming larger and painful without drainage over the last several days. No fever. No dental pain. No injury.  The history is provided by the patient. No language interpreter was used.      History reviewed. No pertinent past medical history.  There are no problems to display for this patient.   History reviewed. No pertinent surgical history.     No family history on file.  Social History   Tobacco Use   Smoking status: Light Smoker  Substance Use Topics   Alcohol use: Yes   Drug use: Yes    Types: Marijuana    Home Medications Prior to Admission medications   Medication Sig Start Date End Date Taking? Authorizing Provider  acetaminophen (TYLENOL) 500 MG tablet Take 500 mg by mouth every 6 (six) hours as needed for mild pain.    [provider]  dicyclomine (BENTYL) 20 MG tablet Take 1 tablet (20 mg total) by mouth 2 (two) times daily between meals as needed for spasms. 01/02/20   Lorelee New, PA-C  loperamide (IMODIUM) 2 MG capsule Take 1 capsule (2 mg total) by mouth 4 (four) times daily as needed for diarrhea or loose stools. 01/02/20   Lorelee New, PA-C  ondansetron (ZOFRAN ODT) 4 MG disintegrating tablet Take 1 tablet (4 mg total) by mouth every 8 (eight) hours as needed for nausea or vomiting. 01/02/20   Lorelee New, PA-C    Allergies    Grass pollen(k-o-r-t-swt vern)  Review of Systems   Review of Systems  Constitutional:  Negative for fever.  HENT:  Negative for dental problem, sore throat and trouble swallowing.   Skin:  Positive for wound.   Physical Exam Updated Vital  Signs BP 128/77   Pulse 82   Temp 97.9 F (36.6 C) (Oral)   Resp 17   Ht 6' (1.829 m)   Wt 125 kg   SpO2 98%   BMI 37.37 kg/m   Physical Exam Vitals and nursing note reviewed.  Constitutional:      Appearance: He is well-developed.  HENT:     Mouth/Throat:     Comments: Large swelling to right submandibular jaw with fluctuance. No drainage.  Pulmonary:     Effort: Pulmonary effort is normal.  Musculoskeletal:        General: Normal range of motion.     Cervical back: Normal range of motion.  Skin:    General: Skin is warm and dry.  Neurological:     Mental Status: He is alert and oriented to person, place, and time.    ED Results / Procedures / Treatments   Labs (all labs ordered are listed, but only abnormal results are displayed) Labs Reviewed  BASIC METABOLIC PANEL - Abnormal; Notable for the following components:      Result Value   Glucose, Bld 103 (*)    All other components within normal limits  CBC WITH DIFFERENTIAL/PLATELET   Results for orders placed or performed during the hospital encounter of 05/26/21  CBC with Differential  Result Value Ref Range   WBC 10.0 4.0 -  10.5 K/uL   RBC 4.53 4.22 - 5.81 MIL/uL   Hemoglobin 14.0 13.0 - 17.0 g/dL   HCT 25.4 27.0 - 62.3 %   MCV 94.0 80.0 - 100.0 fL   MCH 30.9 26.0 - 34.0 pg   MCHC 32.9 30.0 - 36.0 g/dL   RDW 76.2 83.1 - 51.7 %   Platelets 219 150 - 400 K/uL   nRBC 0.0 0.0 - 0.2 %   Neutrophils Relative % 54 %   Neutro Abs 5.4 1.7 - 7.7 K/uL   Lymphocytes Relative 36 %   Lymphs Abs 3.6 0.7 - 4.0 K/uL   Monocytes Relative 7 %   Monocytes Absolute 0.7 0.1 - 1.0 K/uL   Eosinophils Relative 2 %   Eosinophils Absolute 0.2 0.0 - 0.5 K/uL   Basophils Relative 1 %   Basophils Absolute 0.1 0.0 - 0.1 K/uL   Immature Granulocytes 0 %   Abs Immature Granulocytes 0.03 0.00 - 0.07 K/uL  Basic metabolic panel  Result Value Ref Range   Sodium 139 135 - 145 mmol/L   Potassium 3.6 3.5 - 5.1 mmol/L   Chloride 106  98 - 111 mmol/L   CO2 25 22 - 32 mmol/L   Glucose, Bld 103 (H) 70 - 99 mg/dL   BUN 11 6 - 20 mg/dL   Creatinine, Ser 6.16 0.61 - 1.24 mg/dL   Calcium 9.3 8.9 - 07.3 mg/dL   GFR, Estimated >71 >06 mL/min   Anion gap 8 5 - 15     EKG None  Radiology No results found.  Procedures .Marland KitchenIncision and Drainage  Date/Time: 05/27/2021 3:46 AM Performed by: Elpidio Anis, PA-C Authorized by: Elpidio Anis, PA-C   Consent:    Consent obtained:  Verbal   Consent given by:  Patient Universal protocol:    Procedure explained and questions answered to patient or proxy's satisfaction: yes     Test results available : yes     Immediately prior to procedure, a time out was called: yes     Patient identity confirmed:  Verbally with patient Location:    Type:  Abscess Pre-procedure details:    Skin preparation:  Povidone-iodine Anesthesia:    Anesthesia method:  Local infiltration   Local anesthetic:  Lidocaine 1% w/o epi Procedure type:    Complexity:  Simple Procedure details:    Incision types:  Single straight   Wound management:  Probed and deloculated and irrigated with saline   Drainage:  Bloody and purulent   Drainage amount:  Moderate   Wound treatment:  Wound left open Post-procedure details:    Procedure completion:  Tolerated well, no immediate complications Comments:     A needle aspiration was attempted at site away from the incision toward the mandibular angle that is indurated and unchanged by I&D of the first area. No purulence aspirated.    Medications Ordered in ED Medications - No data to display  ED Course  I have reviewed the triage vital signs and the nursing notes.  Pertinent labs & imaging results that were available during my care of the patient were reviewed by me and considered in my medical decision making (see chart for details).    MDM Rules/Calculators/A&P                           Patient to ED with cutaneous abscess to right jaw. Suspect  sebaceous cyst with secondary infection based on history.   I&D performed.  There was minimal sebaceous material expressed. There is still a large area of raised, hard/firm, nonmobile cyst but no purulence on aspiration of 2nd area, as detailed in the procedure note. Will start abx and recommend 2 day recheck to insure swelling is dissipating.   Final Clinical Impression(s) / ED Diagnoses Final diagnoses:  None   Cutaneous abscess  Rx / DC Orders ED Discharge Orders     None        Danne Harbor 05/27/21 0438    Dione Booze, MD 05/27/21 [phone]

## 2021-05-27 NOTE — Discharge Instructions (Addendum)
Take the antibiotic as prescribed. Warm compresses to the area to promote drainage. Norco for severe pain. Recommend ibuprofen 600 mg every 6 hours.   Return to the ED in 2-3 days for recheck of the abscessed area to insure improvement.

## 2023-04-20 ENCOUNTER — Other Ambulatory Visit: Payer: Self-pay

## 2023-04-20 ENCOUNTER — Encounter (HOSPITAL_COMMUNITY): Payer: Self-pay | Admitting: Emergency Medicine

## 2023-04-20 ENCOUNTER — Emergency Department (HOSPITAL_COMMUNITY)
Admission: EM | Admit: 2023-04-20 | Discharge: 2023-04-20 | Disposition: A | Discharge status: Home / Self Care | Payer: 59 | Attending: Emergency Medicine | Admitting: Emergency Medicine

## 2023-04-20 DIAGNOSIS — K029 Dental caries, unspecified: Secondary | ICD-10-CM | POA: Diagnosis not present

## 2023-04-20 DIAGNOSIS — K0889 Other specified disorders of teeth and supporting structures: Secondary | ICD-10-CM | POA: Diagnosis present

## 2023-04-20 MED ORDER — PENICILLIN V POTASSIUM 500 MG PO TABS: 500.0000 mg | ORAL_TABLET | Freq: Four times a day (QID) | ORAL | 0 refills | Status: AC

## 2023-04-20 MED ORDER — MAGIC MOUTHWASH: 5.0000 mL | Freq: Three times a day (TID) | ORAL | 0 refills | Status: AC | PRN

## 2023-04-20 MED ORDER — LIDOCAINE VISCOUS HCL 2 % MT SOLN
15.0000 mL | Freq: Once | OROMUCOSAL | Status: AC
  Administered 2023-04-20: 15 mL via OROMUCOSAL
  Filled 2023-04-20: qty 15

## 2023-04-20 MED ORDER — PENICILLIN V POTASSIUM 500 MG PO TABS
500.0000 mg | ORAL_TABLET | Freq: Once | ORAL | Status: AC
  Administered 2023-04-20: 500 mg via ORAL
  Filled 2023-04-20: qty 1

## 2023-04-20 MED ORDER — MAGIC MOUTHWASH: 5.0000 mL | Freq: Three times a day (TID) | ORAL | 0 refills | Status: DC | PRN

## 2023-04-20 NOTE — ED Triage Notes (Signed)
Presents for dental pain x 2 weeks, here for pain control until dental appt in AM.

## 2023-04-20 NOTE — ED Provider Notes (Signed)
[place] EMERGENCY DEPARTMENT AT [facility] Provider Note   CSN: [id number] Arrival date & time: 04/20/23  2536     History  Chief Complaint  Patient presents with   Dental Pain    Sean Simon is a 32 y.o. male.  The history is provided by the patient.  Dental Pain Location:  Lower Lower teeth location:  31/RL 2nd molar and 30/RL 1st molar Quality:  Aching Severity:  Moderate Onset quality:  Gradual Duration:  2 weeks Timing:  Constant Progression:  Unchanged Chronicity:  Recurrent Context: dental caries and poor dentition   Previous work-up:  Dental exam Relieved by:  Nothing Worsened by:  Nothing Ineffective treatments:  None tried Associated symptoms: no facial swelling and no fever        Home Medications Prior to Admission medications   Medication Sig Start Date End Date Taking? Authorizing Provider  penicillin v potassium (VEETID) 500 MG tablet Take 1 tablet (500 mg total) by mouth 4 (four) times daily for 7 days. 04/20/23 04/27/23 Yes Giuseppina Quinones, MD  acetaminophen (TYLENOL) 500 MG tablet Take 500 mg by mouth every 6 (six) hours as needed for mild pain.    [provider]  dicyclomine (BENTYL) 20 MG tablet Take 1 tablet (20 mg total) by mouth 2 (two) times daily between meals as needed for spasms. Patient not taking: No sig reported 01/02/20   Lorelee New, PA-C  doxycycline (VIBRAMYCIN) 100 MG capsule Take 1 capsule (100 mg total) by mouth 2 (two) times daily. 05/27/21   Elpidio Anis, PA-C  HYDROcodone-acetaminophen (NORCO/VICODIN) 5-325 MG tablet Take 1 tablet by mouth every 4 (four) hours as needed for severe pain. 05/27/21   Elpidio Anis, PA-C  loperamide (IMODIUM) 2 MG capsule Take 1 capsule (2 mg total) by mouth 4 (four) times daily as needed for diarrhea or loose stools. Patient not taking: No sig reported 01/02/20   Lorelee New, PA-C  magic mouthwash SOLN Take 5 mLs by mouth 3 (three) times daily as needed for mouth pain.  04/20/23   Jaquaveon Bilal, MD  ondansetron (ZOFRAN ODT) 4 MG disintegrating tablet Take 1 tablet (4 mg total) by mouth every 8 (eight) hours as needed for nausea or vomiting. Patient not taking: No sig reported 01/02/20   Lorelee New, PA-C      Allergies    Grass pollen(k-o-r-t-swt vern)    Review of Systems   Review of Systems  Constitutional:  Negative for fever.  HENT:  Positive for dental problem. Negative for facial swelling.   All other systems reviewed and are negative.   Physical Exam Updated Vital Signs BP (!) 164/110 (BP Location: Left Arm)   Pulse 78   Temp 98.2 F (36.8 C) (Oral)   Resp 18   Ht 6' (1.829 m)   Wt 111.1 kg   SpO2 100%   BMI 33.23 kg/m  Physical Exam Vitals and nursing note reviewed. Exam conducted with a chaperone present.  Constitutional:      General: He is not in acute distress.    Appearance: Normal appearance. He is well-developed. He is not diaphoretic.  HENT:     Head: Normocephalic and atraumatic.     Nose: Nose normal.     Mouth/Throat:     Comments: Dental caries  Eyes:     Conjunctiva/sclera: Conjunctivae normal.     Pupils: Pupils are equal, round, and reactive to light.  Cardiovascular:     Rate and Rhythm: Normal rate and  regular rhythm.  Pulmonary:     Effort: Pulmonary effort is normal.     Breath sounds: Normal breath sounds. No wheezing or rales.  Abdominal:     General: Abdomen is flat. Bowel sounds are normal.     Palpations: Abdomen is soft.     Tenderness: There is no abdominal tenderness. There is no guarding or rebound.  Musculoskeletal:        General: Normal range of motion.     Cervical back: Normal range of motion and neck supple.  Skin:    General: Skin is warm and dry.     Capillary Refill: Capillary refill takes less than 2 seconds.  Neurological:     General: No focal deficit present.     Mental Status: He is alert and oriented to person, place, and time.     Deep Tendon Reflexes: Reflexes normal.   Psychiatric:        Mood and Affect: Mood normal.        Behavior: Behavior normal.     ED Results / Procedures / Treatments   Labs (all labs ordered are listed, but only abnormal results are displayed) Labs Reviewed - No data to display  EKG None  Radiology No results found.  Procedures Procedures    Medications Ordered in ED Medications  penicillin v potassium (VEETID) tablet 500 mg (500 mg Oral Given 04/20/23 0355)  lidocaine (XYLOCAINE) 2 % viscous mouth solution 15 mL (15 mLs Mouth/Throat Given 04/20/23 0356)    ED Course/ Medical Decision Making/ A&P                             Medical Decision Making Dental caries with pain   Amount and/or Complexity of Data Reviewed External Data Reviewed: notes.    Details: Previous notes reviewed   Risk Prescription drug management. Risk Details: Patient needs dental imaging and care by a dentist.  Will start penicillin and magic mouthwash.      Final Clinical Impression(s) / ED Diagnoses Final diagnoses:  Dental caries   Return for intractable cough, coughing up blood, fevers > 100.4 unrelieved by medication, shortness of breath, intractable vomiting, chest pain, shortness of breath, weakness, numbness, changes in speech, facial asymmetry, abdominal pain, passing out, Inability to tolerate liquids or food, cough, altered mental status or any concerns. No signs of systemic illness or infection. The patient is nontoxic-appearing on exam and vital signs are within normal limits.  I have reviewed the triage vital signs and the nursing notes. Pertinent labs & imaging results that were available during my care of the patient were reviewed by me and considered in my medical decision making (see chart for details). After history, exam, and medical workup I feel the patient has been appropriately medically screened and is safe for discharge home. Pertinent diagnoses were discussed with the patient. Patient was given return  precautions.  Rx / DC Orders ED Discharge Orders          Ordered    penicillin v potassium (VEETID) 500 MG tablet  4 times daily        04/20/23 0351    magic mouthwash SOLN  3 times daily PRN,   Status:  Discontinued        04/20/23 0351    magic mouthwash SOLN  3 times daily PRN        04/20/23 0352  Masen Salvas, MD 04/20/23 [phone]
